# Patient Record
Sex: Female | Born: 1990 | Hispanic: Yes | Marital: Single | Smoking: Never smoker
Health system: Southern US, Community
[De-identification: ages and names within clinical notes are randomized; demographics above are authoritative.]

---
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
## 2017-03-07 ENCOUNTER — Ambulatory Visit: Payer: Self-pay | Admitting: Primary Care

## 2017-03-18 ENCOUNTER — Encounter: Payer: Self-pay | Admitting: Primary Care

## 2017-03-18 ENCOUNTER — Ambulatory Visit (INDEPENDENT_AMBULATORY_CARE_PROVIDER_SITE_OTHER): Payer: BLUE CROSS/BLUE SHIELD | Admitting: Primary Care

## 2017-03-18 DIAGNOSIS — R229 Localized swelling, mass and lump, unspecified: Secondary | ICD-10-CM

## 2017-03-18 NOTE — Patient Instructions (Signed)
The spot under your eye is likely either a cyst or ball of fat. This is nothing to worry about.  Please contact me if you'd like to see a specialist to discuss removal.  It was a pleasure to meet you today! Please don't hesitate to call me with any questions. Welcome to [facility]!

## 2017-03-18 NOTE — Assessment & Plan Note (Signed)
Lipoma or cyst-like mass to left eye lid.  Does not feel or appear suspicious. Unable to feel second mass. Would not recommend she have this surgically removed given location and that she has no symptoms and is not bothered by the mass. Discussed that the cyst/lipoma may return even after surgically removed. Offered opthalmology referral today, she declines and will notify me if she'd like to see further evaluation.

## 2017-03-18 NOTE — Progress Notes (Signed)
   Subjective:    Patient ID: Rachel Wilkins, female    DOB: 1991/08/18, 26 y.o.   MRN: [id number]  HPI  Rachel Wilkins is a 26 year old female who presents today to establish care and discuss the problems mentioned below. Will obtain old records.  1) Eye Mass: Noticed a "ball" under the eye lid to the left eye that has been present for the past 1 year. She was evaluated by an optometrist in January 2018 who thought the "ball" was a cyst or lipoma. The optometrist offered for her to see a specialist as she was concerned. She has not yet seen the specialist. Since her optometry visit she's noticed a second "ball" that is next to the original "ball" that is smaller. She noticed the second ball about 1-2 months ago.  She denies pain, changes in vision, watery eyes, irritation to her eye, swelling. She is mostly concerned that's she's developed a second "ball".   Review of Systems  Eyes: Negative for pain, discharge, redness, itching and visual disturbance.       Eye mass  Skin: Negative for color change and wound.  Neurological: Negative for dizziness and headaches.       No past medical history on file.   Social History   Social History  . Marital status: Single    Spouse name: N/A  . Number of children: N/A  . Years of education: N/A   Occupational History  . Not on file.   Social History Main Topics  . Smoking status: Never Smoker  . Smokeless tobacco: Never Used  . Alcohol use No  . Drug use: Unknown  . Sexual activity: Not on file   Other Topics Concern  . Not on file   Social History Narrative   Single.   No children.   Planning on starting [employer] in Spring 2019.   Works as an Armed forces operational officeraccounting Clerk.   Enjoys watching movies, relaxing.     No past surgical history on file.  Family History  Problem Relation Age of Onset  . Sleep apnea Father   . Stroke Father     No Known Allergies  No current outpatient prescriptions on file prior to visit.   No  current facility-administered medications on file prior to visit.     BP 114/70   Pulse 90   Temp 98 F (36.7 C) (Oral)   Ht 5' 5.5" (1.664 m)   Wt 222 lb 6.4 oz (100.9 kg)   LMP 03/01/2017   SpO2 99%   BMI 36.45 kg/m    Objective:   Physical Exam  Constitutional: She appears well-nourished.  Eyes:  1-2 mm circular, mobile, soft mass to left upper eye lid, more laterally located. Non tender. No erythema.  Neck: Neck supple.  Cardiovascular: Normal rate and regular rhythm.   Pulmonary/Chest: Effort normal and breath sounds normal.  Skin: Skin is warm and dry.          Assessment & Plan:

## 2017-06-13 ENCOUNTER — Encounter: Payer: Self-pay | Admitting: Primary Care

## 2020-12-02 ENCOUNTER — Other Ambulatory Visit: Payer: Self-pay

## 2020-12-02 ENCOUNTER — Ambulatory Visit (INDEPENDENT_AMBULATORY_CARE_PROVIDER_SITE_OTHER): Payer: BLUE CROSS/BLUE SHIELD

## 2020-12-02 ENCOUNTER — Ambulatory Visit
Admission: RE | Admit: 2020-12-02 | Discharge: 2020-12-02 | Disposition: A | Discharge status: Home / Self Care | Payer: BLUE CROSS/BLUE SHIELD | Source: Ambulatory Visit | Attending: Physician Assistant | Admitting: Physician Assistant

## 2020-12-02 ENCOUNTER — Ambulatory Visit: Payer: Self-pay

## 2020-12-02 VITALS — BP 143/82 | HR 98 | Temp 98.4°F | Resp 18 | Ht 65.5 in | Wt 222.4 lb

## 2020-12-02 DIAGNOSIS — R197 Diarrhea, unspecified: Secondary | ICD-10-CM

## 2020-12-02 DIAGNOSIS — R519 Headache, unspecified: Secondary | ICD-10-CM

## 2020-12-02 DIAGNOSIS — R1012 Left upper quadrant pain: Secondary | ICD-10-CM

## 2020-12-02 DIAGNOSIS — M546 Pain in thoracic spine: Secondary | ICD-10-CM | POA: Diagnosis not present

## 2020-12-02 LAB — URINALYSIS, COMPLETE (UACMP) WITH MICROSCOPIC
Bacteria, UA: NONE SEEN
Bilirubin Urine: NEGATIVE
Glucose, UA: NEGATIVE mg/dL
Ketones, ur: NEGATIVE mg/dL
Leukocytes,Ua: NEGATIVE
Nitrite: NEGATIVE
Protein, ur: NEGATIVE mg/dL
Specific Gravity, Urine: 1.01 (ref 1.005–1.030)
WBC, UA: NONE SEEN WBC/hpf (ref 0–5)
pH: 6.5 (ref 5.0–8.0)

## 2020-12-02 LAB — CBC WITH DIFFERENTIAL/PLATELET
Abs Immature Granulocytes: 0.02 10*3/uL (ref 0.00–0.07)
Basophils Absolute: 0 10*3/uL (ref 0.0–0.1)
Basophils Relative: 1 %
Eosinophils Absolute: 0.1 10*3/uL (ref 0.0–0.5)
Eosinophils Relative: 1 %
HCT: 40 % (ref 36.0–46.0)
Hemoglobin: 13.7 g/dL (ref 12.0–15.0)
Immature Granulocytes: 0 %
Lymphocytes Relative: 31 %
Lymphs Abs: 2.3 10*3/uL (ref 0.7–4.0)
MCH: 30.4 pg (ref 26.0–34.0)
MCHC: 34.3 g/dL (ref 30.0–36.0)
MCV: 88.7 fL (ref 80.0–100.0)
Monocytes Absolute: 0.4 10*3/uL (ref 0.1–1.0)
Monocytes Relative: 5 %
Neutro Abs: 4.7 10*3/uL (ref 1.7–7.7)
Neutrophils Relative %: 62 %
Platelets: 366 10*3/uL (ref 150–400)
RBC: 4.51 MIL/uL (ref 3.87–5.11)
RDW: 12.5 % (ref 11.5–15.5)
WBC: 7.5 10*3/uL (ref 4.0–10.5)
nRBC: 0 % (ref 0.0–0.2)

## 2020-12-02 LAB — LIPASE, BLOOD: Lipase: 21 U/L (ref 11–51)

## 2020-12-02 LAB — COMPREHENSIVE METABOLIC PANEL
ALT: 20 U/L (ref 0–44)
AST: 22 U/L (ref 15–41)
Albumin: 4 g/dL (ref 3.5–5.0)
Alkaline Phosphatase: 60 U/L (ref 38–126)
Anion gap: 10 (ref 5–15)
BUN: 9 mg/dL (ref 6–20)
CO2: 26 mmol/L (ref 22–32)
Calcium: 9 mg/dL (ref 8.9–10.3)
Chloride: 101 mmol/L (ref 98–111)
Creatinine, Ser: 0.66 mg/dL (ref 0.44–1.00)
GFR, Estimated: >60 mL/min (ref >60)
Glucose, Bld: 111 mg/dL — ABNORMAL HIGH (ref 70–99)
Potassium: 3.5 mmol/L (ref 3.5–5.1)
Sodium: 137 mmol/L (ref 135–145)
Total Bilirubin: 0.5 mg/dL (ref 0.3–1.2)
Total Protein: 7.8 g/dL (ref 6.5–8.1)

## 2020-12-02 MED ORDER — TIZANIDINE HCL 4 MG PO TABS: 4.0000 mg | ORAL_TABLET | Freq: Three times a day (TID) | ORAL | 0 refills | Status: AC | PRN

## 2020-12-02 NOTE — ED Provider Notes (Signed)
[facility]    CSN: [id number] Arrival date & time: 12/02/20  1318      History   Chief Complaint Chief Complaint  Patient presents with  . Abdominal Pain  . Diarrhea    HPI Rachel Wilkins is a 30 y.o. female presenting for multiple complaints.  First patient states that she has had left upper to mid back pain over the past week.  She says it hurts to touch this area.  She also has some pain with twisting.  Patient states that over the past 1 to 2 weeks she has been trying to eat healthier and increase her exercise.  She says that she is walking, running and trying to lift some weights.  Patient denies any lower back pain, dysuria, urinary frequency urgency.  No nausea/vomiting.  No vaginal discharge or concern for STIs.  Has not taken any medication for pain relief.  Additionally, patient notes that she had an episode of severe left upper quadrant and central abdominal pain 1 week ago that was severe and lasted for "several seconds" and then resolved.  She says that she had this episode after she ate [employer].  She says she has intermittently had mild pain in the left upper quadrant since and it feels "bubbly."  Denies any current abdominal pain.  She does admit to some increased burping and passing gas.  Patient says that she had diarrhea for about a week that seemed to resolve over the past week.  Patient is currently on her menstrual cycle which started yesterday.  Patient denies any history of GI problems.  No recent travel and no recent antibiotic use.  Patient does admit to having COVID-19 1 month ago.  She says she has fully recovered from that and no longer has a cough, congestion, or sore throat.  She denies any chest pain or breathing difficulty.    Patient also admits to mild intermittent headaches but says she believes they are related to stress.  Admits to increased stress recently.  No vision changes, dizziness, weakness, confusion, speech or balance problems.   Patient has no other complaints or concerns today.  HPI  History reviewed. No pertinent past medical history.  Patient Active Problem List   Diagnosis Date Noted  . Skin mass 03/18/2017    History reviewed. No pertinent surgical history.  OB History   No obstetric history on file.      Home Medications    Prior to Admission medications   Medication Sig Start Date End Date Taking? Authorizing Provider  norgestimate-ethinyl estradiol (ORTHO-CYCLEN,SPRINTEC,PREVIFEM) 0.25-35 MG-MCG tablet Take 1 tablet by mouth daily.   Yes [provider]    Family History Family History  Problem Relation Age of Onset  . Sleep apnea Father   . Stroke Father     Social History Social History   Tobacco Use  . Smoking status: Never Smoker  . Smokeless tobacco: Never Used  Vaping Use  . Vaping Use: Never used  Substance Use Topics  . Alcohol use: No  . Drug use: Not Currently     Allergies   Patient has no known allergies.   Review of Systems Review of Systems  Constitutional: Negative for chills, diaphoresis, fatigue and fever.  HENT: Negative for congestion, ear pain, rhinorrhea, sinus pressure, sinus pain and sore throat.   Respiratory: Negative for cough and shortness of breath.   Cardiovascular: Negative for chest pain.  Gastrointestinal: Positive for abdominal pain and diarrhea (resolved). Negative for blood in  stool, constipation, nausea and vomiting.  Genitourinary: Negative for dysuria, flank pain, pelvic pain and vaginal discharge.  Musculoskeletal: Positive for back pain. Negative for arthralgias and myalgias.  Skin: Negative for rash.  Neurological: Positive for headaches (mild and intermittent). Negative for weakness.  Hematological: Negative for adenopathy.     Physical Exam Triage Vital Signs ED Triage Vitals  Enc Vitals Group     BP 12/02/20 1333 (!) 143/82     Pulse Rate 12/02/20 1333 98     Resp 12/02/20 1333 18     Temp 12/02/20 1333 98.4  F (36.9 C)     Temp Source 12/02/20 1333 Oral     SpO2 12/02/20 1333 100 %     Weight 12/02/20 1331 222 lb 7.1 oz (100.9 kg)     Height 12/02/20 1331 5' 5.5" (1.664 m)     Head Circumference --      Peak Flow --      Pain Score 12/02/20 1330 5     Pain Loc --      Pain Edu? --      Excl. in GC? --    No data found.  Updated Vital Signs BP (!) 143/82 (BP Location: Left Arm)   Pulse 98   Temp 98.4 F (36.9 C) (Oral)   Resp 18   Ht 5' 5.5" (1.664 m)   Wt 222 lb 7.1 oz (100.9 kg)   LMP 12/01/2020 (Exact Date)   SpO2 100%   BMI 36.45 kg/m       Physical Exam Vitals and nursing note reviewed.  Constitutional:      General: She is not in acute distress.    Appearance: Normal appearance. She is not ill-appearing or toxic-appearing.  HENT:     Head: Normocephalic and atraumatic.     Nose: Nose normal.     Mouth/Throat:     Mouth: Mucous membranes are moist.     Pharynx: Oropharynx is clear.  Eyes:     General: No scleral icterus.       Right eye: No discharge.        Left eye: No discharge.     Conjunctiva/sclera: Conjunctivae normal.  Cardiovascular:     Rate and Rhythm: Normal rate and regular rhythm.     Heart sounds: Normal heart sounds.  Pulmonary:     Effort: Pulmonary effort is normal. No respiratory distress.     Breath sounds: Normal breath sounds. No wheezing, rhonchi or rales.  Musculoskeletal:     Cervical back: Neck supple.     Thoracic back: Tenderness (diffuse TTP throughout left parathoracic muscles) present. Normal range of motion.  Skin:    General: Skin is dry.  Neurological:     General: No focal deficit present.     Mental Status: She is alert. Mental status is at baseline.     Motor: No weakness.     Gait: Gait normal.  Psychiatric:        Mood and Affect: Mood normal.        Behavior: Behavior normal.        Thought Content: Thought content normal.      UC Treatments / Results  Labs (all labs ordered are listed, but only abnormal  results are displayed) Labs Reviewed  COMPREHENSIVE METABOLIC PANEL - Abnormal; Notable for the following components:      Result Value   Glucose, Bld 111 (*)    All other components within normal limits  CBC WITH DIFFERENTIAL/PLATELET  LIPASE, BLOOD  URINALYSIS, COMPLETE (UACMP) WITH MICROSCOPIC    EKG   Radiology DG Chest 2 View  Result Date: 12/02/2020 CLINICAL DATA:  Upper back pain. EXAM: CHEST - 2 VIEW COMPARISON:  None. FINDINGS: The heart size and mediastinal contours are within normal limits. Both lungs are clear. No pneumothorax or pleural effusion is noted. The visualized skeletal structures are unremarkable. IMPRESSION: No active cardiopulmonary disease. Electronically Signed   By: Lupita Raider M.D.   On: 12/02/2020 14:21    Procedures Procedures (including critical care time)  Medications Ordered in UC Medications - No data to display  Initial Impression / Assessment and Plan / UC Course  I have reviewed the triage vital signs and the nursing notes.  Pertinent labs & imaging results that were available during my care of the patient were reviewed by me and considered in my medical decision making (see chart for details).     30 year old female presenting for multiple complaints including left mid/upper back pain, upper abdominal pain, and headaches.  Patient with diagnosis of COVID-19 1 month ago.  Blood pressure slightly elevated at 143/82.  Other vital signs are normal and stable.  Exam significant for tenderness throughout the left thoracal lumbar region.  No abdominal tenderness on exam.  Chest is clear to auscultation heart regular rate and rhythm.  1. Back pain: Chest x-ray performed today due to complaint of left mid/upper back pain since she did have a diagnosis of COVID-19 a month ago.  Evaluated for possible pneumonia.  Chest x-ray is within normal limits. Independently reviewed images.  A urinalysis was performed today to assess for possible UTI.   Urinalysis shows blood, otherwise normal findings.  Suspected back pain is likely musculoskeletal especially given her recent increase in physical activity.  Advised supportive care with NSAIDs/Tylenol for pain relief.  Advised stretches.  Sent tizanidine.  Reviewed ED precautions for back pain.  2.  Upper abdominal pain: As mentioned, patient had no abdominal tenderness on exam.  She did admit to looking a few things up online and was concerned about possible gallbladder problems and pancreatitis.  CBC, CMP and lipase performed today.  CBC is normal.  Glucose slightly elevated at 111.  She is not fasting.  Lipase is normal.  Patient's description of symptoms may be consistent with gastritis and/or gas pain.  Advised Pepcid and over-the-counter Gas-X.  Increase rest and fluids.  Avoid any triggering foods.  ED precautions for abdominal pain reviewed patient.  3. Headache: Headache may likely be related to tension.  No red flags on exam.  Advised supportive care. Advised ibuprofen or Tylenol for headaches.  ED precautions for headache reviewed patient.  Final Clinical Impressions(s) / UC Diagnoses   Final diagnoses:  Acute left-sided thoracic back pain  Abdominal pain, left upper quadrant  Acute nonintractable headache, unspecified headache type     Discharge Instructions     BACK PAIN: Stressed avoiding painful activities . RICE (REST, ICE, COMPRESSION, ELEVATION) guidelines reviewed. May alternate ice and heat. Consider use of muscle rubs, Salonpas patches, etc. Use medications as directed including muscle relaxers if prescribed. Take anti-inflammatory medications as prescribed or OTC NSAIDs/Tylenol.  F/u with PCP in 7-10 days for reexamination, and please feel free to call or return to the urgent care at any time for any questions or concerns you may have and we will be happy to help you!   BACK PAIN RED FLAGS: If the back pain acutely worsens or there are any red flag  symptoms such as  numbness/tingling, leg weakness, saddle anesthesia, or loss of bowel/bladder control, go immediately to the ER. Follow up with [place] as scheduled or sooner if the pain does not begin to resolve or if it worsens before the follow up    ABDOMINAL PAIN: You may take Tylenol for pain relief. Use medications as directed including antiemetics and antidiarrheal medications if suggested or prescribed. You should increase fluids and electrolytes as well as rest over these next several days. If you have any questions or concerns, or if your symptoms are not improving or if especially if they acutely worsen, please call or stop back to the clinic immediately and we will be happy to help you or go to the ER   ABDOMINAL PAIN RED FLAGS: Seek immediate further care if: symptoms remain the same or worsen over the next 3-7 days, you are unable to keep fluids down, you see blood or mucus in your stool, you vomit black or dark red material, you have a fever of 101.F or higher, you have localized and/or persistent abdominal pain        ED Prescriptions    None     PDMP not reviewed this encounter.   Shirlee Latch, PA-C 12/02/20 1454

## 2020-12-02 NOTE — Discharge Instructions (Addendum)
BACK PAIN: Exam and workup most consistent with musculoskeletal back pain. Stressed avoiding painful activities . RICE (REST, ICE, COMPRESSION, ELEVATION) guidelines reviewed. May alternate ice and heat. Consider use of muscle rubs, Salonpas patches, etc. Use medications as directed including muscle relaxers if prescribed. Take anti-inflammatory medications as prescribed or OTC NSAIDs/Tylenol.  F/u with PCP in 7-10 days for reexamination, and please feel free to call or return to the urgent care at any time for any questions or concerns you may have and we will be happy to help you!   BACK PAIN RED FLAGS: If the back pain acutely worsens or there are any red flag symptoms such as numbness/tingling, leg weakness, saddle anesthesia, or loss of bowel/bladder control, go immediately to the ER. Follow up with [place] as scheduled or sooner if the pain does not begin to resolve or if it worsens before the follow up    ABDOMINAL PAIN: Exam and work up most consistent with gastritis and/or gas pain. Consider OTC Pepcid and Gas-X. Avoid trigger foods. You may take Tylenol for pain relief. Use medications as directed including antiemetics and antidiarrheal medications if suggested or prescribed. You should increase fluids and electrolytes as well as rest over these next several days. If you have any questions or concerns, or if your symptoms are not improving or if especially if they acutely worsen, please call or stop back to the clinic immediately and we will be happy to help you or go to the ER   ABDOMINAL PAIN RED FLAGS: Seek immediate further care if: symptoms remain the same or worsen over the next 3-7 days, you are unable to keep fluids down, you see blood or mucus in your stool, you vomit black or dark red material, you have a fever of 101.F or higher, you have localized and/or persistent abdominal pain    HEADACHE: You were seen in clinic today for headache. Headaches are consistent with tension headaches. Rest  and take meds as directed. If at any point, the headache becomes very severe, is associated with fever, is associated with neck pain/stiffness, you feel like passing out, the headache is different from any you've have had before, there are vision changes/issues with speech/issues with balance, or numbness/weakness in a part of the body, you should be seen urgently or emergently for more serious causes of headache

## 2020-12-02 NOTE — ED Triage Notes (Signed)
Pt c/o epigastric abdominal pain, diarrhea. Started about a week ago. She states she also has back pain on the left side that is painful to the touch.

## 2021-02-06 ENCOUNTER — Other Ambulatory Visit: Payer: Self-pay

## 2021-02-06 ENCOUNTER — Ambulatory Visit
Admission: RE | Admit: 2021-02-06 | Discharge: 2021-02-06 | Disposition: A | Discharge status: Home / Self Care | Payer: BLUE CROSS/BLUE SHIELD | Source: Ambulatory Visit | Attending: Emergency Medicine | Admitting: Emergency Medicine

## 2021-02-06 VITALS — BP 120/64 | HR 84 | Temp 98.2°F | Resp 18 | Ht 66.0 in | Wt 193.0 lb

## 2021-02-06 DIAGNOSIS — L03213 Periorbital cellulitis: Secondary | ICD-10-CM

## 2021-02-06 MED ORDER — AMOXICILLIN-POT CLAVULANATE 875-125 MG PO TABS: 1.0000 | ORAL_TABLET | Freq: Two times a day (BID) | ORAL | 0 refills | Status: DC

## 2021-02-06 NOTE — Discharge Instructions (Signed)
Take the antibiotic as directed.     Go to the emergency department if you have acute eye pain, changes in your vision, increased redness, increased swelling, or other concerning symptoms.    Follow-up with your primary care provider in 2 to 3 days for a recheck of your eye.

## 2021-02-06 NOTE — ED Provider Notes (Signed)
[name]    CSN: [id number] Arrival date & time: 02/06/21  1110      History   Chief Complaint Chief Complaint  Patient presents with  . Facial Swelling    HPI Rachel Wilkins is a 30 y.o. female.   Patient presents with nontender redness and swelling under her left eye since yesterday morning.  She states she woke up and had 2 bumps under her eye.  Then she woke up this morning with increased redness and swelling.  She denies fever, chills, eye pain, changes in vision, itching, or other symptoms.  No treatment attempted at home.  She denies pertinent medical history.  The history is provided by the patient.    History reviewed. No pertinent past medical history.  Patient Active Problem List   Diagnosis Date Noted  . Skin mass 03/18/2017    History reviewed. No pertinent surgical history.  OB History   No obstetric history on file.      Home Medications    Prior to Admission medications   Medication Sig Start Date End Date Taking? Authorizing Provider  amoxicillin-clavulanate (AUGMENTIN) 875-125 MG tablet Take 1 tablet by mouth every 12 (twelve) hours. 02/06/21  Yes Mickie Bail, NP  norgestimate-ethinyl estradiol (ORTHO-CYCLEN,SPRINTEC,PREVIFEM) 0.25-35 MG-MCG tablet Take 1 tablet by mouth daily.    [provider]    Family History Family History  Problem Relation Age of Onset  . Sleep apnea Father   . Stroke Father     Social History Social History   Tobacco Use  . Smoking status: Never Smoker  . Smokeless tobacco: Never Used  Vaping Use  . Vaping Use: Never used  Substance Use Topics  . Alcohol use: No  . Drug use: Not Currently     Allergies   Patient has no known allergies.   Review of Systems Review of Systems  Constitutional: Negative for chills and fever.  HENT: Negative for ear pain and sore throat.   Eyes: Positive for redness. Negative for photophobia, pain, discharge, itching and visual disturbance.   Respiratory: Negative for cough and shortness of breath.   Cardiovascular: Negative for chest pain and palpitations.  Gastrointestinal: Negative for abdominal pain and vomiting.  Genitourinary: Negative for dysuria and hematuria.  Musculoskeletal: Negative for arthralgias and back pain.  Skin: Negative for color change and rash.  Neurological: Negative for seizures and syncope.  All other systems reviewed and are negative.    Physical Exam Triage Vital Signs ED Triage Vitals  Enc Vitals Group     BP      Pulse      Resp      Temp      Temp src      SpO2      Weight      Height      Head Circumference      Peak Flow      Pain Score      Pain Loc      Pain Edu?      Excl. in GC?    No data found.  Updated Vital Signs BP 120/64   Pulse 84   Temp 98.2 F (36.8 C) (Oral)   Resp 18   Ht 5\' 6"  (1.676 m)   Wt 193 lb (87.5 kg)   LMP 01/28/2021   SpO2 98%   BMI 31.15 kg/m   Visual Acuity Right Eye Distance:   Left Eye Distance:   Bilateral Distance:    Right  Eye Near:   Left Eye Near:    Bilateral Near:     Physical Exam Vitals and nursing note reviewed.  Constitutional:      General: She is not in acute distress.    Appearance: She is well-developed. She is not ill-appearing.  HENT:     Head: Normocephalic and atraumatic.     Mouth/Throat:     Mouth: Mucous membranes are moist.  Eyes:     Extraocular Movements: Extraocular movements intact.     Conjunctiva/sclera: Conjunctivae normal.     Pupils: Pupils are equal, round, and reactive to light.     Comments: Mild redness and swelling of left lower eyelid.  See picture for details.  Cardiovascular:     Rate and Rhythm: Normal rate and regular rhythm.     Heart sounds: Normal heart sounds.  Pulmonary:     Effort: Pulmonary effort is normal. No respiratory distress.     Breath sounds: Normal breath sounds.  Abdominal:     Palpations: Abdomen is soft.     Tenderness: There is no abdominal tenderness.   Musculoskeletal:     Cervical back: Neck supple.  Skin:    General: Skin is warm and dry.  Neurological:     General: No focal deficit present.     Mental Status: She is alert and oriented to person, place, and time.  Psychiatric:        Mood and Affect: Mood normal.        Behavior: Behavior normal.        UC Treatments / Results  Labs (all labs ordered are listed, but only abnormal results are displayed) Labs Reviewed - No data to display  EKG   Radiology No results found.  Procedures Procedures (including critical care time)  Medications Ordered in UC Medications - No data to display  Initial Impression / Assessment and Plan / UC Course  I have reviewed the triage vital signs and the nursing notes.  Pertinent labs & imaging results that were available during my care of the patient were reviewed by me and considered in my medical decision making (see chart for details).   Periorbital cellulitis of left eye.  No eye pain or changes in vision.  Treating with Augmentin.  Strict ED precautions discussed with patient if she has worsening symptoms or new symptoms.  Instructed her to follow-up with her PCP for recheck in 2 to 3 days.  She agrees to plan of care.   Final Clinical Impressions(s) / UC Diagnoses   Final diagnoses:  Periorbital cellulitis of left eye     Discharge Instructions     Take the antibiotic as directed.     Go to the emergency department if you have acute eye pain, changes in your vision, increased redness, increased swelling, or other concerning symptoms.    Follow-up with your primary care provider in 2 to 3 days for a recheck of your eye.        ED Prescriptions    Medication Sig Dispense Auth. Provider   amoxicillin-clavulanate (AUGMENTIN) 875-125 MG tablet Take 1 tablet by mouth every 12 (twelve) hours. 14 tablet Mickie Bail, NP     PDMP not reviewed this encounter.   Mickie Bail, NP 02/06/21 1204

## 2021-02-06 NOTE — ED Triage Notes (Signed)
Pt reports having swelling under L eye that she noticed on yesterday. The area is red and some swelling. Denies having any visual disturbances,  pain or itching.

## 2022-01-09 ENCOUNTER — Other Ambulatory Visit: Payer: Self-pay | Admitting: Primary Care

## 2022-01-09 DIAGNOSIS — N6324 Unspecified lump in the left breast, lower inner quadrant: Secondary | ICD-10-CM

## 2022-01-12 ENCOUNTER — Ambulatory Visit
Admission: RE | Admit: 2022-01-12 | Discharge: 2022-01-12 | Disposition: A | Discharge status: Home / Self Care | Payer: BLUE CROSS/BLUE SHIELD | Source: Ambulatory Visit | Attending: Primary Care | Admitting: Primary Care

## 2022-01-12 DIAGNOSIS — N6324 Unspecified lump in the left breast, lower inner quadrant: Secondary | ICD-10-CM | POA: Diagnosis present

## 2023-06-28 IMAGING — US US BREAST*L* LIMITED INC AXILLA
1 series · 7 of 7 positions shown · non-contrast
Comparison: None.

CLINICAL DATA: 30-year-old female presenting for evaluation of a
palpable lump in the inferior to lower inner left breast identified
on clinical breast exam.

EXAM:
ULTRASOUND OF THE LEFT BREAST

[Series 1: us breast*left* limited inc axilla · 0.08mm/px · 7 of 7 slices shown]
[im 1/7]
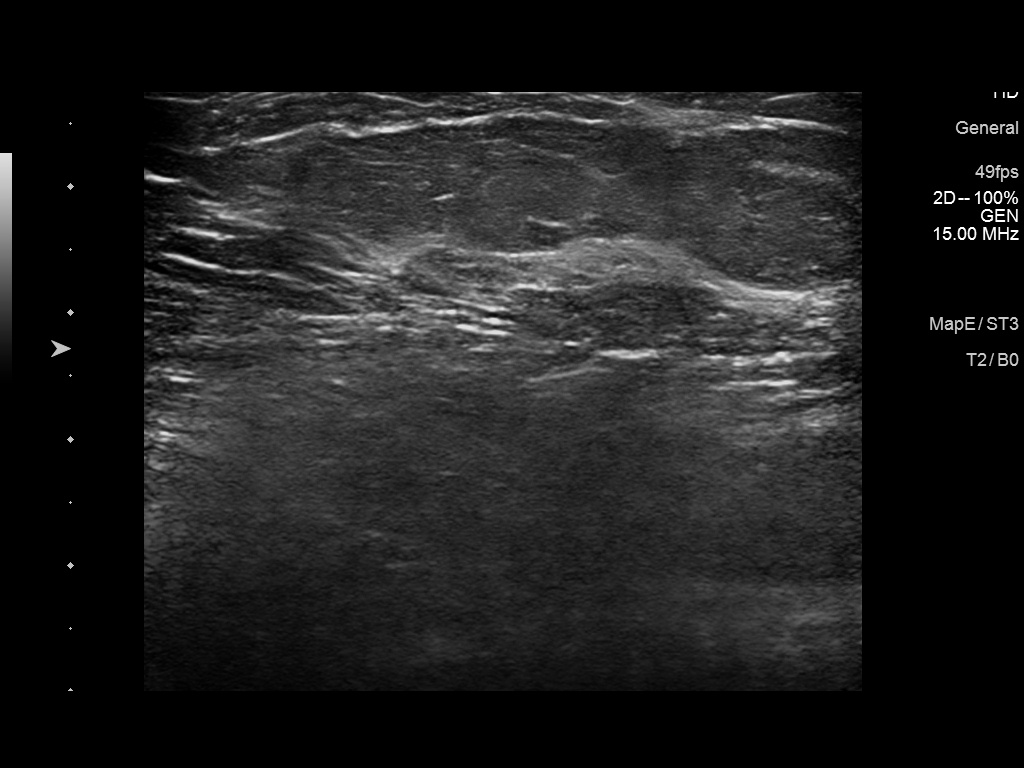
[im 2/7]
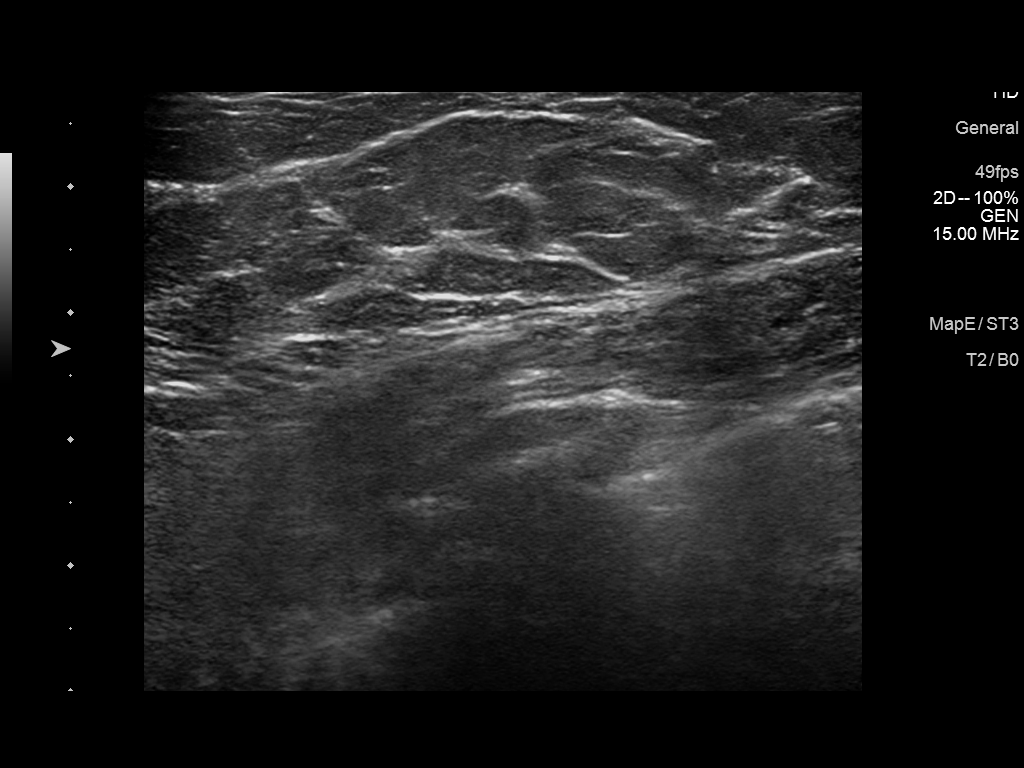
[im 3/7]
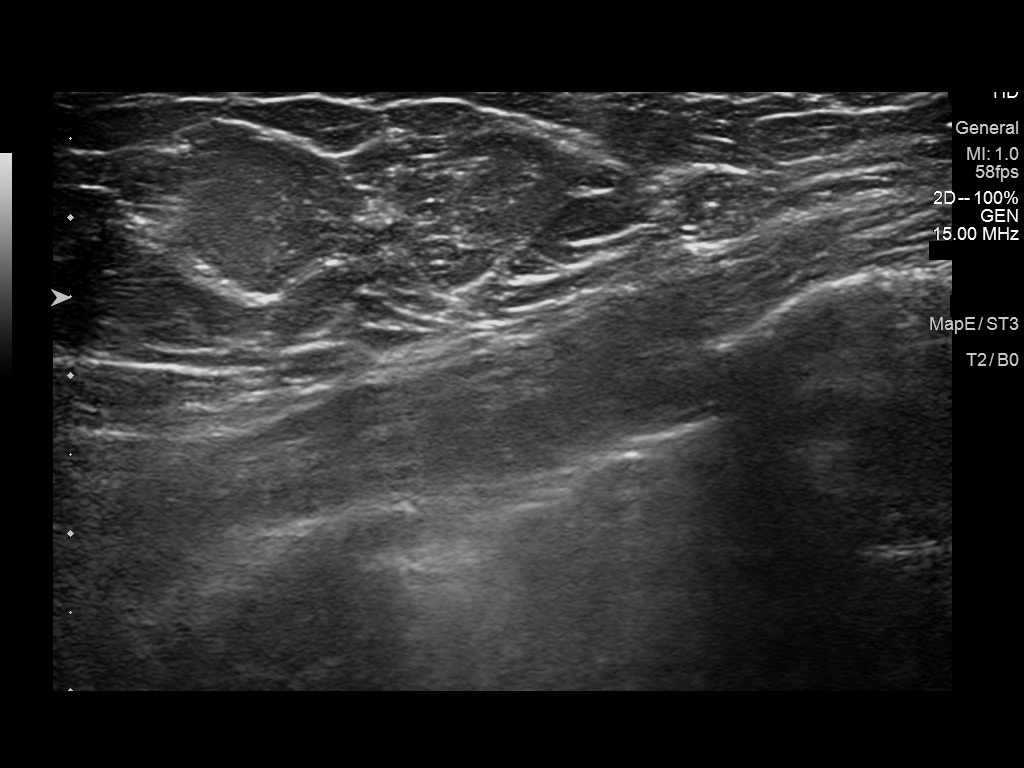
[im 4/7]
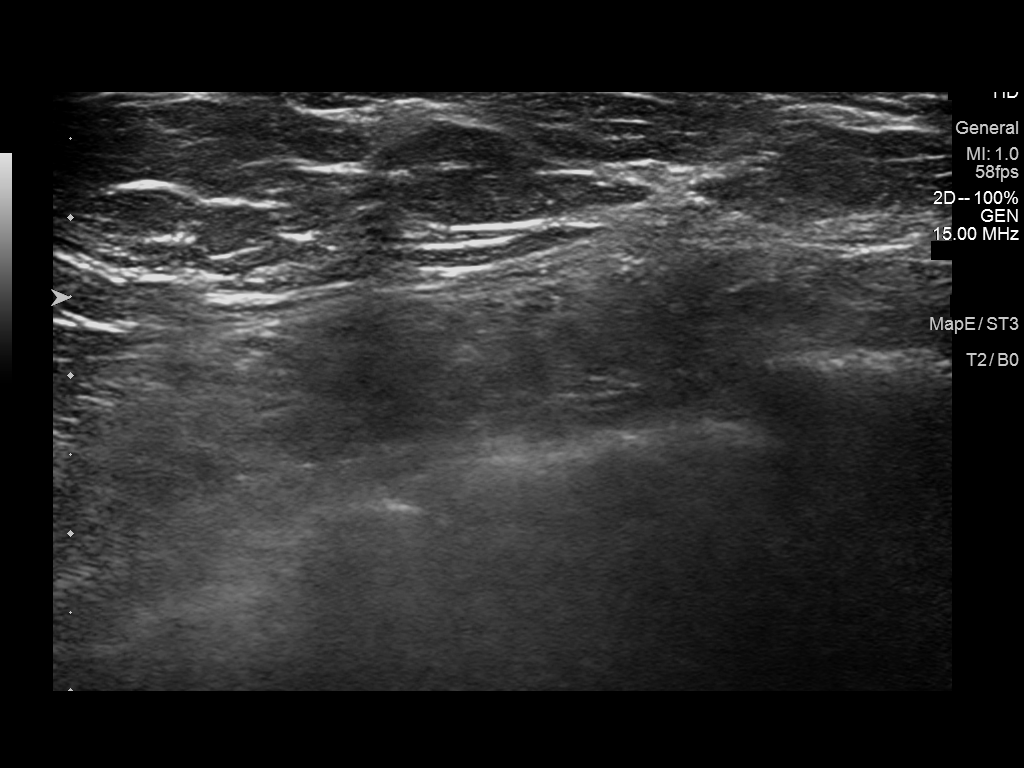
[im 5/7]
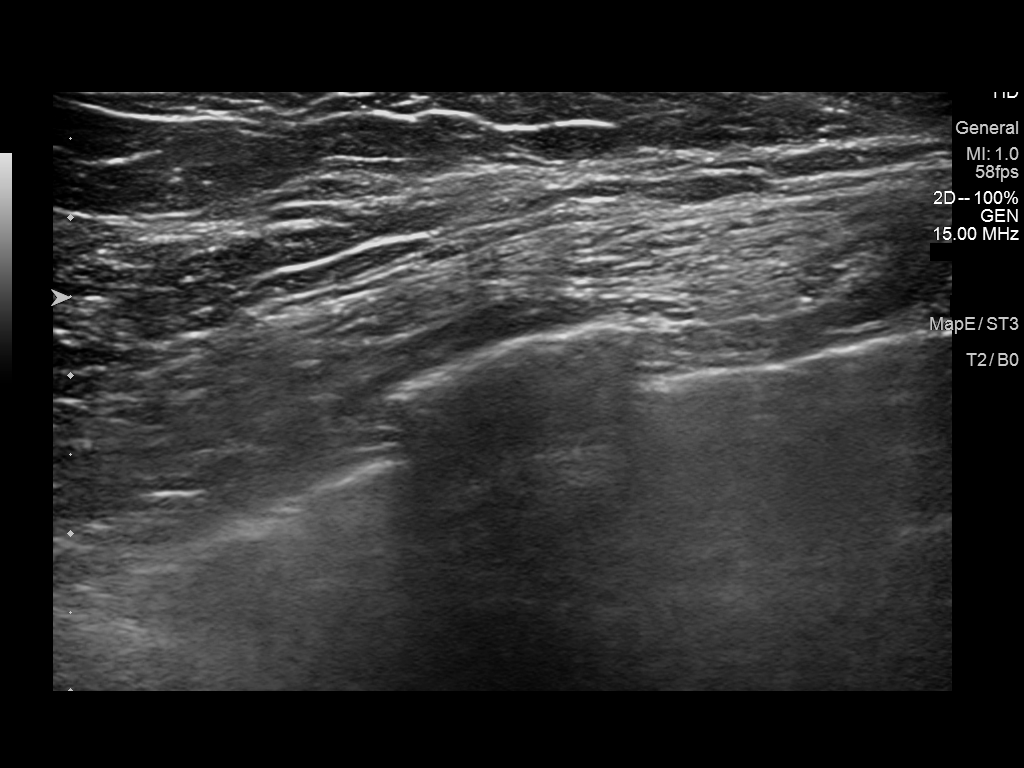
[im 6/7]
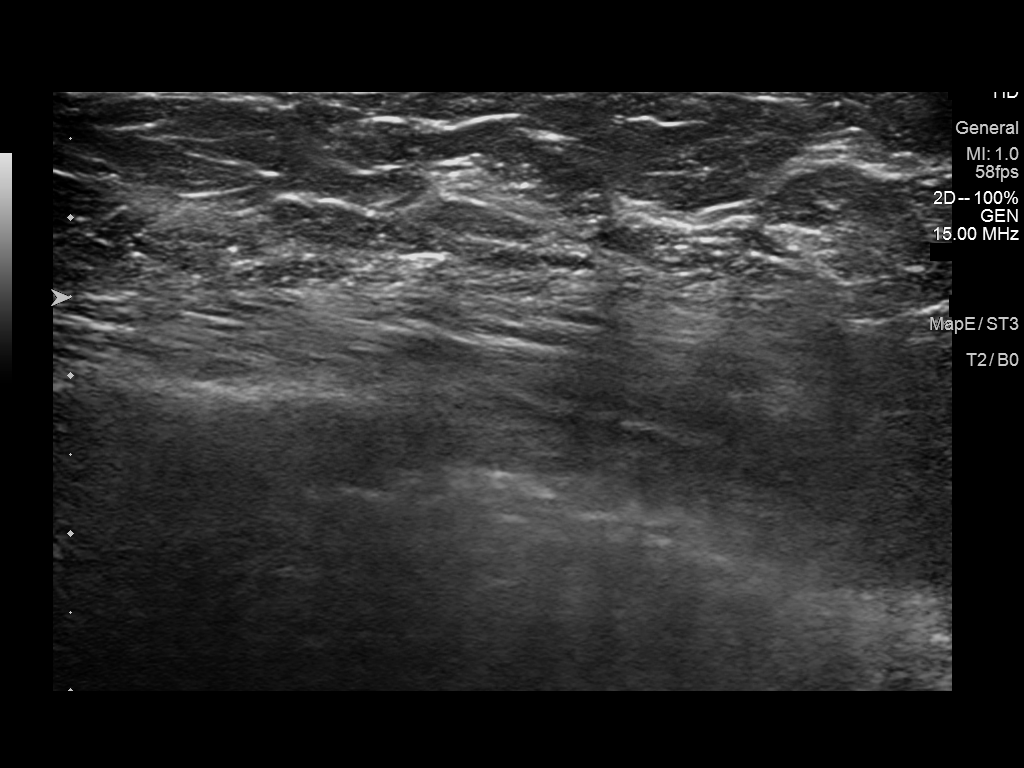
[im 7/7]
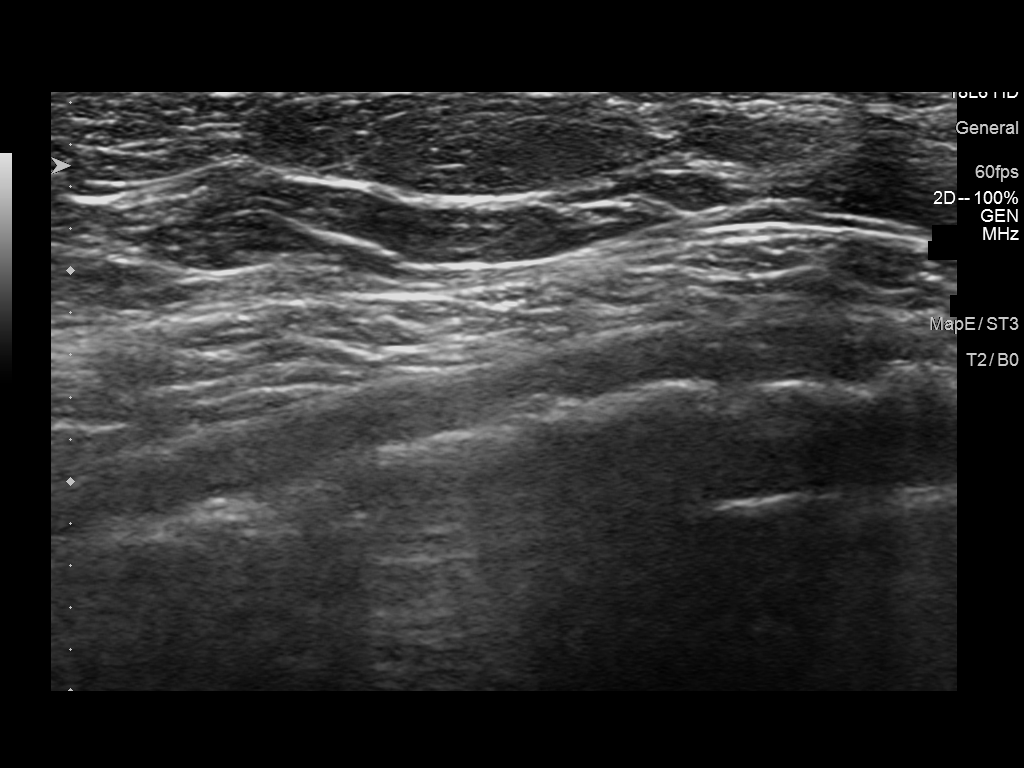

[7 of 7 positions shown; findings below may reference images not displayed]

FINDINGS: On physical exam, there is a rubbery mobile palpable lump in the
lower slightly inner left breast.

Ultrasound targeted to the palpable site at 6-7 o'clock in the left
breast demonstrates a normal benign-appearing fat lobule. Imaging of
the remainder of the lower inner left breast demonstrates normal
fibroglandular tissue. No suspicious masses or areas of shadowing
are identified.
IMPRESSION: 1. There are no suspicious findings in the inferior to lower inner
left breast.

RECOMMENDATION:
1. Clinical follow-up recommended for the palpable area of concern
in the left breast. Any further workup should be based on clinical
grounds.

2. Screening mammogram at age 42 unless there are persistent or
intervening clinical concerns. (Code:CK-9-SPH)

I have discussed the findings and recommendations with the patient.
If applicable, a reminder letter will be sent to the patient
regarding the next appointment.

BI-RADS CATEGORY  1: Negative.

## 2023-09-20 ENCOUNTER — Ambulatory Visit: Payer: BLUE CROSS/BLUE SHIELD | Admitting: Podiatry

## 2023-09-25 ENCOUNTER — Ambulatory Visit: Payer: BLUE CROSS/BLUE SHIELD | Admitting: Podiatry

## 2023-09-25 ENCOUNTER — Encounter: Payer: Self-pay | Admitting: Podiatry

## 2023-09-25 DIAGNOSIS — L03032 Cellulitis of left toe: Secondary | ICD-10-CM

## 2023-09-25 MED ORDER — NEOMYCIN-POLYMYXIN-HC 1 % OT SOLN: OTIC | 1 refills | Status: DC

## 2023-09-25 NOTE — Patient Instructions (Signed)

## 2023-09-25 NOTE — Progress Notes (Signed)
Subjective:  Patient ID: Rachel Wilkins, female    DOB: 1990-12-05,  MRN: [id number] HPI Chief Complaint  Patient presents with   Toe Pain    Hallux left - swollen, red base of nail x 2 months, initially just tender, then worsened over the weeks, PCP eval 08/23/23-Rx'd doxy and triamcinolone cream-got better, then worse again, went to Urgent Care 09/11/23-drained and cultured-staph infection, continued antibiotics, got better, now worsened again   New Patient (Initial Visit)    32 y.o. female presents with the above complaint.   ROS: Denies fever chills nausea vomiting muscle aches pains calf pain back pain chest pain shortness of breath.  No past medical history on file. No past surgical history on file.  Current Outpatient Medications:    NEOMYCIN-POLYMYXIN-HYDROCORTISONE (CORTISPORIN) 1 % SOLN OTIC solution, Apply 1-2 drops to toe BID after soaking, Disp: 10 mL, Rfl: 1   TRI-SPRINTEC 0.18/0.215/0.25 MG-35 MCG tablet, Take 1 tablet by mouth daily., Disp: , Rfl:   No Known Allergies Review of Systems Objective:  There were no vitals filed for this visit.  General: Well developed, nourished, in no acute distress, alert and oriented x3   Dermatological: Skin is warm, dry and supple bilateral. Nails x 10 are well maintained; remaining integument appears unremarkable at this time. There are no open sores, no preulcerative lesions, no rash or signs of infection present.  Hallux left does demonstrate postinflammatory hyperpigmentation along the proximal nail fold with no eponychium present.  It appears to be fluctuant around the nail.  The nail has not grown.  Vascular: Dorsalis Pedis artery and Posterior Tibial artery pedal pulses are 2/4 bilateral with immedate capillary fill time. Pedal hair growth present. No varicosities and no lower extremity edema present bilateral.   Neruologic: Grossly intact via light touch bilateral. Vibratory intact via tuning fork bilateral. Protective  threshold with Semmes Wienstein monofilament intact to all pedal sites bilateral. Patellar and Achilles deep tendon reflexes 2+ bilateral. No Babinski or clonus noted bilateral.   Musculoskeletal: No gross boney pedal deformities bilateral. No pain, crepitus, or limitation noted with foot and ankle range of motion bilateral. Muscular strength 5/5 in all groups tested bilateral.  Gait: Unassisted, Nonantalgic.    Radiographs:  None taken  Assessment & Plan:   Assessment: Chronic proximal paronychia hallux left.  Plan: Since previous incision and drainage with multiple rounds of antibiotics have failed to render this patient asymptomatic I feel that is necessary to remove the nail plate exposed to the root and clean out any remaining infection.  She verbally agrees to this after much explanation.  She understands this and is amenable to it.  After local anesthetic was administered I removed the nail plate finding a large abscessed area along the medial nail fold proximally.  There was granulation tissue present which I resected.  I removed all necrotic tissue in the nail plate.  There was mild odor but not overwhelming.  No cultures were taken since she is just finishing her antibiotic.  The area was flushed with alcohol Betadine ointment and a dry sterile compressive dressing was applied.  She was given both oral and written home-going instruction for care and soaking of the toe as well as a note to be out of work or to work remotely for the next 2 to 3 days.  I will follow-up with her in about 3 weeks for reevaluation should she have questions or concerns she will notify us immediately.     Asmar Brozek T. [place], [place]

## 2023-10-14 ENCOUNTER — Telehealth: Payer: Self-pay | Admitting: Podiatry

## 2023-10-14 ENCOUNTER — Encounter: Payer: Self-pay | Admitting: Podiatry

## 2023-10-14 NOTE — Telephone Encounter (Signed)
Patient called asking about her drops on her toe nail. She had it removed. She was wondering when should she stop them. Please call pt and let her know.

## 2023-10-23 ENCOUNTER — Ambulatory Visit: Payer: BLUE CROSS/BLUE SHIELD | Admitting: Podiatry

## 2023-10-24 ENCOUNTER — Encounter: Payer: Self-pay | Admitting: Podiatry

## 2023-10-24 ENCOUNTER — Ambulatory Visit (INDEPENDENT_AMBULATORY_CARE_PROVIDER_SITE_OTHER): Payer: Commercial Managed Care - PPO | Admitting: Podiatry

## 2023-10-24 DIAGNOSIS — L03032 Cellulitis of left toe: Secondary | ICD-10-CM

## 2023-10-24 NOTE — Progress Notes (Signed)
 Subjective: Rachel Wilkins is a 33 y.o.  female returns to office today for follow up evaluation after having left 1st total nail avulsion performed. Patient has been soaking using epsom salt and applying topical antibiotic covered with bandaid daily. Patient denies fevers, chills, nausea, vomiting. Denies any calf pain, chest pain, SOB.   Objective:  Vitals: Reviewed  General: Well developed, nourished, in no acute distress, alert and oriented x3   Dermatology: Skin is warm, dry and supple bilateral. left 1st nail bed appears to be clean, dry, with mild granular tissue and surrounding scab. There is no surrounding erythema, edema, drainage/purulence. The remaining nails appear unremarkable at this time. There are no other lesions or other signs of infection present.  Neurovascular status: Intact. No lower extremity swelling; No pain with calf compression bilateral.  Musculoskeletal: Decreased tenderness to palpation of the left 1st nail bed. Muscular strength within normal limits bilateral.   Assesement and Plan: S/p total nail avulsion, doing well.   -Continue soaking in epsom salts twice a day followed by antibiotic ointment and a band-aid. Can leave uncovered at night. Continue this until completely healed.  -If the area has not healed in 2 weeks, call the office for follow-up appointment, or sooner if any problems arise.  -Monitor for any signs/symptoms of infection. Call the office immediately if any occur or go directly to the emergency room. Call with any questions/concerns.  Avontae Burkhead, DPM

## 2024-08-21 NOTE — Progress Notes (Signed)
 08/24/2024 Rachel Wilkins [id number] [birth year]  Gastroenterology Office Note    Referring Provider: Cecily Katz, PA-C Primary Care Physician:  Era Raisin, NP  Primary GI Provider: Jinny Carmine, MD    Chief Complaint   Chief Complaint  Patient presents with   New Patient (Initial Visit)    Constipation-started taking fiber supplement-seen blood in stool from watery stool recently- has hx hemorrhoids- LLQ pain - has reflux-feels like things get stuck in throat-     History of Present Illness   Rachel Wilkins is a 33 y.o. female presenting today at the request of Cecily Katz, PA-C due to abdominal pain and blood in the stool.   Patient reports that 2 weeks ago she had 1 episode of seeing a small amount of dark red blood with an episode of watery stools.  She did have mild abdominal pain but the watery stools. She saw her PCP right after this but she has had no further episodes.  She reports her normal bowel pattern is usually constipation.  There have been times when she could go almost a week and not have a bowel movement.  She has started taking fiber recently and bowel movements have improved a little bit.  Now she is having a bowel movement every other day, mostly soft formed stools.  Denies rectal pain or melena.  Patient reports that she does eat spicy foods daily and has acid reflux, or a burning sensation when she eats.  She reports that if she eats too fast she feels like the food gets balled up in her throat, but when she slows down to eat she is fine. She drinks energy drinks 3 times a week, 1 soda Coke zero daily.  States that she tries to drink 60 ounces of water daily.  Denies nausea or vomiting. Denies NSAID use, rare alcohol use.  Patient reports that PCP prescribed pantoprazole, she has picked it up from the pharmacy but did not want to take it until she saw GI.   No family history of colon cancer.   08/10/2024 Lipase 21, CBC and Liver  WNL, H. Pylori stool negative  Pantoprazole 40 mg once daily  History reviewed. No pertinent past medical history.  History reviewed. No pertinent surgical history.  Current Outpatient Medications  Medication Sig Dispense Refill   pantoprazole (PROTONIX) 40 MG tablet Take 40 mg by mouth daily.     No current facility-administered medications for this visit.    Allergies as of 08/24/2024   (No Known Allergies)    Family History  Problem Relation Age of Onset   Heart disease Father    Sleep apnea Father    Stroke Father     Social History   Socioeconomic History   Marital status: Single    Spouse name: Not on file   Number of children: Not on file   Years of education: Not on file   Highest education level: Not on file  Occupational History   Not on file  Tobacco Use   Smoking status: Never   Smokeless tobacco: Never  Vaping Use   Vaping status: Never Used  Substance and Sexual Activity   Alcohol use: No   Drug use: Not Currently   Sexual activity: Not on file  Other Topics Concern   Not on file  Social History Narrative   Single.   No children.   Planning on starting [employer] in Spring 2019.   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   Enjoys  watching movies, relaxing.    Social Drivers of Corporate Investment Banker Strain: Not on file  Food Insecurity: Not on file  Transportation Needs: Not on file  Physical Activity: Not on file  Stress: Not on file  Social Connections: Not on file  Intimate Partner Violence: Not on file     RELEVANT GI HISTORY, IMAGING AND LABS: CBC    Component Value Date/Time   WBC 7.5 12/02/2020 1359   RBC 4.51 12/02/2020 1359   HGB 13.7 12/02/2020 1359   HCT 40.0 12/02/2020 1359   PLT 366 12/02/2020 1359   MCV 88.7 12/02/2020 1359   MCH 30.4 12/02/2020 1359   MCHC 34.3 12/02/2020 1359   RDW 12.5 12/02/2020 1359   LYMPHSABS 2.3 12/02/2020 1359   MONOABS 0.4 12/02/2020 1359   EOSABS 0.1 12/02/2020 1359   BASOSABS 0.0 12/02/2020  1359   No results for input(s): HGB in the last 8760 hours.  CMP     Component Value Date/Time   NA 137 12/02/2020 1359   K 3.5 12/02/2020 1359   CL 101 12/02/2020 1359   CO2 26 12/02/2020 1359   GLUCOSE 111 (H) 12/02/2020 1359   BUN 9 12/02/2020 1359   CREATININE 0.66 12/02/2020 1359   CALCIUM 9.0 12/02/2020 1359   PROT 7.8 12/02/2020 1359   ALBUMIN 4.0 12/02/2020 1359   AST 22 12/02/2020 1359   ALT 20 12/02/2020 1359   ALKPHOS 60 12/02/2020 1359   BILITOT 0.5 12/02/2020 1359   GFRNONAA >60 12/02/2020 1359      Latest Ref Rng & Units 12/02/2020    1:59 PM  Hepatic Function  Total Protein 6.5 - 8.1 g/dL 7.8   Albumin 3.5 - 5.0 g/dL 4.0   AST 15 - 41 U/L 22   ALT 0 - 44 U/L 20   Alk Phosphatase 38 - 126 U/L 60   Total Bilirubin 0.3 - 1.2 mg/dL 0.5       Review of Systems   All systems reviewed and negative except where noted in HPI.    Physical Exam  BP 100/68   Pulse (!) 106   Temp 98.4 F (36.9 C)   Ht 5' 6 (1.676 m)   Wt 158 lb 9.6 oz (71.9 kg)   LMP 08/03/2024   SpO2 100%   BMI 25.60 kg/m  Patient's last menstrual period was 08/03/2024. General:   Alert and oriented. Pleasant and cooperative. Well-nourished and well-developed. NAD. Head:  Normocephalic and atraumatic. Eyes:  Without icterus Ears:  Normal auditory acuity. Lungs:  Respirations even and unlabored.  Clear throughout to auscultation.   No wheezes, crackles, or rhonchi. No acute distress. Heart:  Regular rate and rhythm; no murmurs, clicks, rubs, or gallops. Abdomen:  Normal bowel sounds.  No bruits.  Soft, non-tender and non-distended without masses, hepatosplenomegaly or hernias noted.  No guarding or rebound tenderness.     Rectal:  Deferred. Msk:  Symmetrical without gross deformities. Normal posture. Extremities:  Without edema. Neurologic:  Alert and  oriented x4;  grossly normal neurologically. Skin:  Intact without significant lesions or rashes. Psych:  Alert and cooperative.  Normal mood and affect.   Assessment & Plan   Rachel Wilkins is a 33 y.o. female presenting today with GERD and constipation.  GERD. Eats spicy foods and feels like food gets stuck if she eats fast.  - start pantoprazole 40 mg once daily 30 min prior to meals. Patient already has prescription at home from PCP.   -  Lifestyle changes discussed, avoid spicy foods/carbonated beverages, and caffeine. - Eat smaller bites and eat food slowly.  - Consider EGD if no improvement.  Constipation. Patient had 1 episode of loose stools with small amount of blood in stool. This has not reoccurred.  - Discussed constipation treatment at length. - Recommend High Fiber diet with fruits, vegetables, and whole grains. - Drink 64 ounces of Fluids Daily. -mStart Miralax Mix 1 capful in a drink daily. - Start Benefiber Mix 1 TBSP in a drink daily. - If no improvement, consider Linzess, Amitiza or Trulance.  - if further occurrence of blood in stools will plan for a colonoscopy.   Follow up in 6 weeks.   Grayce Bohr, DNP, AGNP-C [facility]

## 2024-08-24 ENCOUNTER — Encounter: Payer: Self-pay | Admitting: Family Medicine

## 2024-08-24 ENCOUNTER — Ambulatory Visit: Admitting: Family Medicine

## 2024-08-24 ENCOUNTER — Other Ambulatory Visit: Payer: Self-pay

## 2024-08-24 VITALS — BP 100/68 | HR 106 | Temp 98.4°F | Ht 66.0 in | Wt 158.6 lb

## 2024-08-24 DIAGNOSIS — K5909 Other constipation: Secondary | ICD-10-CM | POA: Diagnosis not present

## 2024-08-24 DIAGNOSIS — K219 Gastro-esophageal reflux disease without esophagitis: Secondary | ICD-10-CM

## 2024-08-24 NOTE — Patient Instructions (Signed)
 Recommend High Fiber diet with fruits, vegetables, and whole grains. Drink 64 ounces of Fluids Daily. Start Miralax Mix 1/2 - 1 capful in a drink daily. Start Benefiber Mix 1 TBSP in a drink daily.

## 2024-10-05 NOTE — Progress Notes (Signed)
 "   10/06/2024 Rachel Wilkins [id number] 1991/06/07  Gastroenterology Office Note     Primary Care Physician:  Era Raisin, NP  Primary GI Provider: Jinny Carmine, MD    Chief Complaint   Chief Complaint  Patient presents with   Follow-up    Constipation- she feels she is not drinking enough to move the miralax and fiber through- has not started taking the pantoprazole- forgot it- but she also in not having gerd daily-only on occasion     History of Present Illness   Rachel Wilkins is a 33 y.o. female with PMHX of abdominal pain and GERD presenting today for follow-up.  Patient last seen by myself on 08/24/2024 for GERD and constipation.  Patient to start pantoprazole 40 mg once daily, fiber, and MiraLAX.  Today, patient reports she has been taking fiber and MiraLAX every other day.  Her bowel patterns have improved, she is having a bowel movement every 1 to 2 days.  Some days it it is only a small amount and she may have to strain a little bit.  Other days it is formed and larger amount of stool.  She has noticed that when she exercises and does not increase her water intake that her stools are little bit harder.  She is only drinking 3 bottles of water daily and reports it is harder for her to drink more water in colder weather.  She denies having any diarrhea.  She has not had any further episodes of rectal bleeding.   Patient reports she is really having any GERD or reflux issues.  She has pantoprazole on hand but has not needed to take it.  She has been trying to eat smaller meals throughout the day.  She was drinking 1 soda daily but now may only have 1 soda per week.  Denies NSAID or alcohol use.   Patient last seen by myself on 08/24/2024 for GERD and constipation.  Patient to start pantoprazole 40 mg once daily, fiber, and MiraLAX.    History reviewed. No pertinent past medical history.  History reviewed. No pertinent surgical history.  Current  Outpatient Medications  Medication Sig Dispense Refill   pantoprazole (PROTONIX) 40 MG tablet Take 40 mg by mouth daily.     TRI-SPRINTEC 0.18/0.215/0.25 MG-35 MCG tablet Take 1 tablet by mouth daily.     No current facility-administered medications for this visit.    Allergies as of 10/06/2024   (No Known Allergies)    Family History  Problem Relation Age of Onset   Heart disease Father    Sleep apnea Father    Stroke Father     Social History   Socioeconomic History   Marital status: Single    Spouse name: Not on file   Number of children: Not on file   Years of education: Not on file   Highest education level: Not on file  Occupational History   Not on file  Tobacco Use   Smoking status: Never   Smokeless tobacco: Never  Vaping Use   Vaping status: Never Used  Substance and Sexual Activity   Alcohol use: No   Drug use: Not Currently   Sexual activity: Not on file  Other Topics Concern   Not on file  Social History Narrative   Single.   No children.   Planning on starting [employer] in Spring 2019.   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   Enjoys watching movies, relaxing.    Social Drivers of Health  Tobacco Use: Low Risk (10/06/2024)   Patient History    Smoking Tobacco Use: Never    Smokeless Tobacco Use: Never    Passive Exposure: Not on file  Financial Resource Strain: Not on file  Food Insecurity: Not on file  Transportation Needs: Not on file  Physical Activity: Not on file  Stress: Not on file  Social Connections: Not on file  Intimate Partner Violence: Not on file  Depression (EYV7-0): Not on file  Alcohol Screen: Not on file  Housing: Not on file  Utilities: Not on file  Health Literacy: Not on file     RELEVANT GI HISTORY, IMAGING AND LABS: CBC    Component Value Date/Time   WBC 7.5 12/02/2020 1359   RBC 4.51 12/02/2020 1359   HGB 13.7 12/02/2020 1359   HCT 40.0 12/02/2020 1359   PLT 366 12/02/2020 1359   MCV 88.7 12/02/2020 1359   MCH  30.4 12/02/2020 1359   MCHC 34.3 12/02/2020 1359   RDW 12.5 12/02/2020 1359   LYMPHSABS 2.3 12/02/2020 1359   MONOABS 0.4 12/02/2020 1359   EOSABS 0.1 12/02/2020 1359   BASOSABS 0.0 12/02/2020 1359   No results for input(s): HGB in the last 8760 hours.  CMP     Component Value Date/Time   NA 137 12/02/2020 1359   K 3.5 12/02/2020 1359   CL 101 12/02/2020 1359   CO2 26 12/02/2020 1359   GLUCOSE 111 (H) 12/02/2020 1359   BUN 9 12/02/2020 1359   CREATININE 0.66 12/02/2020 1359   CALCIUM 9.0 12/02/2020 1359   PROT 7.8 12/02/2020 1359   ALBUMIN 4.0 12/02/2020 1359   AST 22 12/02/2020 1359   ALT 20 12/02/2020 1359   ALKPHOS 60 12/02/2020 1359   BILITOT 0.5 12/02/2020 1359   GFRNONAA >60 12/02/2020 1359      Latest Ref Rng & Units 12/02/2020    1:59 PM  Hepatic Function  Total Protein 6.5 - 8.1 g/dL 7.8   Albumin 3.5 - 5.0 g/dL 4.0   AST 15 - 41 U/L 22   ALT 0 - 44 U/L 20   Alk Phosphatase 38 - 126 U/L 60   Total Bilirubin 0.3 - 1.2 mg/dL 0.5       Review of Systems   All systems reviewed and negative except where noted in HPI.    Physical Exam  BP 106/70   Pulse 82   Temp (!) 97.5 F (36.4 C)   Ht 5' 6 (1.676 m)   Wt 160 lb 3.2 oz (72.7 kg)   LMP 10/02/2024   SpO2 98%   BMI 25.86 kg/m  Patient's last menstrual period was 10/02/2024. General:   Alert and oriented. Pleasant and cooperative. Well-nourished and well-developed.  Head:  Normocephalic and atraumatic. NAD. Eyes:  Without icterus Ears:  Normal auditory acuity. Abdomen:  Normal bowel sounds.  No bruits.  Soft, non-tender and non-distended without masses, hepatosplenomegaly or hernias noted.  No guarding or rebound tenderness.   Rectal:  Deferred. Msk:  Symmetrical without gross deformities. Normal posture. Extremities:  Without edema. Neurologic:  Alert and  oriented x4;  grossly normal neurologically. Skin:  Intact without significant lesions or rashes. Psych:  Alert and cooperative. Normal  mood and affect.   Assessment & Plan   Rachel Wilkins is a 33 y.o. female presenting today for follow-up on GERD and constipation.   Constipation.  Overall improvement with bowel movements. Patient can tell that stools are harder when she takes fiber supplement and  does not drink enough water.   - Encouraged patient to eat more foods with fiber - Increase water intake to 64 ounces daily and continue MiraLAX every other day - Patient will let me know if she wants to try Linzess in the future if constipation worsens  Gerd.  Continue lifestyle and dietary modifications.  Can take pantoprazole if needed.    Will follow up in 6 months or sooner if need.   Grayce Bohr, DNP, AGNP-C [facility]   "

## 2024-10-06 ENCOUNTER — Ambulatory Visit: Admitting: Family Medicine

## 2024-10-06 ENCOUNTER — Encounter: Payer: Self-pay | Admitting: Family Medicine

## 2024-10-06 VITALS — BP 106/70 | HR 82 | Temp 97.5°F | Ht 66.0 in | Wt 160.2 lb

## 2024-10-06 DIAGNOSIS — K219 Gastro-esophageal reflux disease without esophagitis: Secondary | ICD-10-CM | POA: Diagnosis not present

## 2024-10-06 DIAGNOSIS — K5900 Constipation, unspecified: Secondary | ICD-10-CM | POA: Diagnosis not present

## 2024-10-06 DIAGNOSIS — K5909 Other constipation: Secondary | ICD-10-CM

## 2024-10-06 NOTE — Patient Instructions (Signed)
 Make sure you are drinking 64 ounces of water daily. Continue miralax. Can take Colace stool softner if needed

## 2024-10-19 ENCOUNTER — Encounter: Payer: Self-pay | Admitting: Podiatry

## 2024-10-20 ENCOUNTER — Ambulatory Visit: Admitting: Podiatry

## 2024-10-20 ENCOUNTER — Encounter: Payer: Self-pay | Admitting: Podiatry

## 2024-10-20 ENCOUNTER — Other Ambulatory Visit: Payer: Self-pay | Admitting: Podiatry

## 2024-10-20 DIAGNOSIS — L600 Ingrowing nail: Secondary | ICD-10-CM | POA: Diagnosis not present

## 2024-10-20 DIAGNOSIS — Z79899 Other long term (current) drug therapy: Secondary | ICD-10-CM

## 2024-10-20 DIAGNOSIS — B351 Tinea unguium: Secondary | ICD-10-CM

## 2024-10-20 NOTE — Patient Instructions (Signed)

## 2024-10-20 NOTE — Progress Notes (Unsigned)
 Ingronw right lateral  Toeniaslx 10 lft lamisol

## 2024-10-21 ENCOUNTER — Other Ambulatory Visit: Payer: Self-pay | Admitting: Podiatry

## 2024-10-21 LAB — HEPATIC FUNCTION PANEL
ALT: 20 IU/L (ref 0–32)
AST: 23 IU/L (ref 0–40)
Albumin: 4.3 g/dL (ref 3.9–4.9)
Alkaline Phosphatase: 61 IU/L (ref 41–116)
Bilirubin Total: 0.3 mg/dL (ref 0.0–1.2)
Bilirubin, Direct: 0.1 mg/dL (ref 0.00–0.40)
Total Protein: 6.9 g/dL (ref 6.0–8.5)

## 2024-10-21 MED ORDER — TERBINAFINE HCL 250 MG PO TABS: 250.0000 mg | ORAL_TABLET | Freq: Every day | ORAL | 0 refills | Status: AC

## 2024-10-29 ENCOUNTER — Ambulatory Visit: Payer: Self-pay | Admitting: Podiatry

## 2025-04-06 ENCOUNTER — Ambulatory Visit: Admitting: Family Medicine
